# Patient Record
Sex: Male | Born: 1965 | Race: White | Hispanic: No | Marital: Married | Smoking: Current every day smoker
Health system: Southern US, Community
[De-identification: ages and names within clinical notes are randomized; demographics above are authoritative.]

## PROBLEM LIST (undated history)

## (undated) DIAGNOSIS — E785 Hyperlipidemia, unspecified: Secondary | ICD-10-CM

## (undated) HISTORY — DX: Hyperlipidemia, unspecified: E78.5

## (undated) HISTORY — PX: HAND SURGERY: SHX662

## (undated) HISTORY — PX: VASECTOMY: SHX75

---

## 2005-04-06 HISTORY — PX: COLONOSCOPY: SHX174

## 2005-11-02 ENCOUNTER — Ambulatory Visit (HOSPITAL_COMMUNITY): Admission: RE | Admit: 2005-11-02 | Discharge: 2005-11-02 | Payer: Self-pay | Admitting: *Deleted

## 2011-01-05 ENCOUNTER — Ambulatory Visit (AMBULATORY_SURGERY_CENTER): Payer: BC Managed Care – PPO | Admitting: *Deleted

## 2011-01-05 VITALS — Ht 72.0 in | Wt 171.2 lb

## 2011-01-05 DIAGNOSIS — Z800 Family history of malignant neoplasm of digestive organs: Secondary | ICD-10-CM

## 2011-01-05 DIAGNOSIS — Z1211 Encounter for screening for malignant neoplasm of colon: Secondary | ICD-10-CM

## 2011-01-05 MED ORDER — PEG-KCL-NACL-NASULF-NA ASC-C 100 G PO SOLR: ORAL | Status: DC

## 2011-01-06 ENCOUNTER — Encounter: Payer: Self-pay | Admitting: Gastroenterology

## 2011-01-19 ENCOUNTER — Ambulatory Visit (AMBULATORY_SURGERY_CENTER): Payer: BC Managed Care – PPO | Admitting: Gastroenterology

## 2011-01-19 ENCOUNTER — Encounter: Payer: Self-pay | Admitting: Gastroenterology

## 2011-01-19 VITALS — BP 125/69 | HR 65 | Temp 97.9°F | Resp 12 | Ht 72.0 in | Wt 171.0 lb

## 2011-01-19 DIAGNOSIS — Z1211 Encounter for screening for malignant neoplasm of colon: Secondary | ICD-10-CM

## 2011-01-19 DIAGNOSIS — Z800 Family history of malignant neoplasm of digestive organs: Secondary | ICD-10-CM

## 2011-01-19 MED ORDER — SODIUM CHLORIDE 0.9 % IV SOLN: 500.0000 mL | INTRAVENOUS | Status: DC

## 2011-01-19 NOTE — Progress Notes (Signed)
VSS in the recovery room, no complaints of pain.  Pt passing air without difficulty.  Wife at bedside and discharge instructions given, understanding voiced

## 2011-01-19 NOTE — Patient Instructions (Signed)
Please review discharge instructions (blue and green sheets)  Resume your normal medications  Review instructions about hemorrhoids and high fiber diets

## 2011-01-20 ENCOUNTER — Telehealth: Payer: Self-pay | Admitting: *Deleted

## 2011-01-20 NOTE — Telephone Encounter (Signed)

## 2015-11-15 ENCOUNTER — Encounter: Payer: Self-pay | Admitting: Gastroenterology

## 2015-11-27 ENCOUNTER — Encounter: Payer: Self-pay | Admitting: Gastroenterology

## 2016-04-03 DIAGNOSIS — H524 Presbyopia: Secondary | ICD-10-CM | POA: Diagnosis not present

## 2016-04-03 DIAGNOSIS — H5213 Myopia, bilateral: Secondary | ICD-10-CM | POA: Diagnosis not present

## 2016-04-03 DIAGNOSIS — H43813 Vitreous degeneration, bilateral: Secondary | ICD-10-CM | POA: Diagnosis not present

## 2016-07-22 DIAGNOSIS — Z0000 Encounter for general adult medical examination without abnormal findings: Secondary | ICD-10-CM | POA: Diagnosis not present

## 2016-07-22 DIAGNOSIS — Z13220 Encounter for screening for lipoid disorders: Secondary | ICD-10-CM | POA: Diagnosis not present

## 2016-07-22 DIAGNOSIS — Z23 Encounter for immunization: Secondary | ICD-10-CM | POA: Diagnosis not present

## 2016-10-28 ENCOUNTER — Encounter: Payer: Self-pay | Admitting: Gastroenterology

## 2016-12-10 ENCOUNTER — Encounter: Payer: Self-pay | Admitting: Gastroenterology

## 2016-12-22 ENCOUNTER — Encounter: Payer: Self-pay | Admitting: Gastroenterology

## 2017-02-01 ENCOUNTER — Ambulatory Visit (AMBULATORY_SURGERY_CENTER): Payer: BLUE CROSS/BLUE SHIELD

## 2017-02-01 VITALS — Ht 72.0 in | Wt 168.0 lb

## 2017-02-01 DIAGNOSIS — Z800 Family history of malignant neoplasm of digestive organs: Secondary | ICD-10-CM

## 2017-02-01 MED ORDER — SUPREP BOWEL PREP KIT 17.5-3.13-1.6 GM/177ML PO SOLN: 1.0000 | Freq: Once | ORAL | 0 refills | Status: AC

## 2017-02-01 NOTE — Progress Notes (Signed)
No allergies to eggs or soy No diet meds No home oxygen No past problems with anesthesia  Declined emmi 

## 2017-02-15 ENCOUNTER — Other Ambulatory Visit: Payer: Self-pay

## 2017-02-15 ENCOUNTER — Encounter: Payer: Self-pay | Admitting: Gastroenterology

## 2017-02-15 ENCOUNTER — Ambulatory Visit (AMBULATORY_SURGERY_CENTER): Payer: BLUE CROSS/BLUE SHIELD | Admitting: Gastroenterology

## 2017-02-15 VITALS — BP 102/62 | HR 53 | Temp 98.4°F | Resp 13 | Ht 72.0 in | Wt 168.0 lb

## 2017-02-15 DIAGNOSIS — Z1211 Encounter for screening for malignant neoplasm of colon: Secondary | ICD-10-CM | POA: Diagnosis present

## 2017-02-15 DIAGNOSIS — D123 Benign neoplasm of transverse colon: Secondary | ICD-10-CM

## 2017-02-15 DIAGNOSIS — Z1212 Encounter for screening for malignant neoplasm of rectum: Secondary | ICD-10-CM | POA: Diagnosis not present

## 2017-02-15 DIAGNOSIS — Z800 Family history of malignant neoplasm of digestive organs: Secondary | ICD-10-CM

## 2017-02-15 MED ORDER — SODIUM CHLORIDE 0.9 % IV SOLN: 500.0000 mL | INTRAVENOUS | Status: DC

## 2017-02-15 NOTE — Progress Notes (Signed)
A/ox3 pleased with MAC, report to Michele RN 

## 2017-02-15 NOTE — Patient Instructions (Signed)
YOU HAD AN ENDOSCOPIC PROCEDURE TODAY AT [facility]:   Refer to the procedure report that was given to you for any specific questions about what was found during the examination.  If the procedure report does not answer your questions, please call your gastroenterologist to clarify.  If you requested that your care partner not be given the details of your procedure findings, then the procedure report has been included in a sealed envelope for you to review at your convenience later.  YOU SHOULD EXPECT: Some feelings of bloating in the abdomen. Passage of more gas than usual.  Walking can help get rid of the air that was put into your GI tract during the procedure and reduce the bloating. If you had a lower endoscopy (such as a colonoscopy or flexible sigmoidoscopy) you may notice spotting of blood in your stool or on the toilet paper. If you underwent a bowel prep for your procedure, you may not have a normal bowel movement for a few days.  Please Note:  You might notice some irritation and congestion in your nose or some drainage.  This is from the oxygen used during your procedure.  There is no need for concern and it should clear up in a day or so.  SYMPTOMS TO REPORT IMMEDIATELY:   Following lower endoscopy (colonoscopy or flexible sigmoidoscopy):  Excessive amounts of blood in the stool  Significant tenderness or worsening of abdominal pains  Swelling of the abdomen that is new, acute  Fever of 100F or higher  For urgent or emergent issues, a gastroenterologist can be reached at any hour by calling [phone].   DIET:  We do recommend a small meal at first, but then you may proceed to your regular diet.  Drink plenty of fluids but you should avoid alcoholic beverages for 24 hours.  ACTIVITY:  You should plan to take it easy for the rest of today and you should NOT DRIVE or use heavy machinery until tomorrow (because of the sedation medicines used during the test).     FOLLOW UP: Our staff will call the number listed on your records the next business day following your procedure to check on you and address any questions or concerns that you may have regarding the information given to you following your procedure. If we do not reach you, we will leave a message.  However, if you are feeling well and you are not experiencing any problems, there is no need to return our call.  We will assume that you have returned to your regular daily activities without incident.  If any biopsies were taken you will be contacted by phone or by letter within the next 1-3 weeks.  Please call us at [phone] if you have not heard about the biopsies in 3 weeks.   Await biopsy results to determine next repeat Colonoscopy screening Polyps (handout given) Hemorrhoids (handout given) Hemorrhoids banding (handout given)  SIGNATURES/CONFIDENTIALITY: You and/or your care partner have signed paperwork which will be entered into your electronic medical record.  These signatures attest to the fact that that the information above on your After Visit Summary has been reviewed and is understood.  Full responsibility of the confidentiality of this discharge information lies with you and/or your care-partner.

## 2017-02-15 NOTE — Progress Notes (Signed)
Pt. Reports no change in medical or surgical history since his pre-visit 02/01/2017.

## 2017-02-15 NOTE — Progress Notes (Signed)
Called to room to assist during endoscopic procedure.  Patient ID and intended procedure confirmed with present staff. Received instructions for my participation in the procedure from the performing physician.  

## 2017-02-15 NOTE — Op Note (Signed)
[facility] Patient Name: Alexander Krause Procedure Date: 02/15/2017 8:59 AM MRN: [id number] Endoscopist: Mallie Mussel L. Loletha Carrow , MD Age: 51 Referring MD:  Date of Birth: 1965-10-13 Gender: Male Account #: [id number] Procedure:                Colonoscopy Indications:              Screening in patient at increased risk: Colorectal                            cancer in father 6 or older Medicines:                Monitored Anesthesia Care Procedure:                Pre-Anesthesia Assessment:                           - Prior to the procedure, a History and Physical                            was performed, and patient medications and                            allergies were reviewed. The patient's tolerance of                            previous anesthesia was also reviewed. The risks                            and benefits of the procedure and the sedation                            options and risks were discussed with the patient.                            All questions were answered, and informed consent                            was obtained. Prior Anticoagulants: The patient has                            taken no previous anticoagulant or antiplatelet                            agents. ASA Grade Assessment: II - A patient with                            mild systemic disease. After reviewing the risks                            and benefits, the patient was deemed in                            satisfactory condition to undergo the procedure.  After obtaining informed consent, the colonoscope                            was passed under direct vision. Throughout the                            procedure, the patient's blood pressure, pulse, and                            oxygen saturations were monitored continuously. The                            Colonoscope was introduced through the anus and                            advanced to the the cecum,  identified by                            appendiceal orifice and ileocecal valve. The                            colonoscopy was performed with moderate difficulty                            due to significant looping. Successful completion                            of the procedure was aided by changing the patient                            to a supine position and using manual pressure. The                            patient tolerated the procedure well. The quality                            of the bowel preparation was excellent. The                            ileocecal valve, appendiceal orifice, and rectum                            were photographed. The quality of the bowel                            preparation was evaluated using the BBPS [facility]                            Bowel Preparation Scale) with scores of: Right                            Colon = 3, Transverse Colon = 3 and Left Colon = 3                            (  entire mucosa seen well with no residual staining,                            small fragments of stool or opaque liquid). The                            total BBPS score equals 9. The bowel preparation                            used was SUPREP. Scope In: 9:06:52 AM Scope Out: 9:32:51 AM Scope Withdrawal Time: 0 hours 13 minutes 52 seconds  Total Procedure Duration: 0 hours 25 minutes 59 seconds  Findings:                 The perianal exam findings include internal                            hemorrhoids that prolapse with straining, but                            require manual replacement into the anal canal                            (Grade III).                           The sigmoid colon revealed significantly excessive                            looping.                           Two sessile polyps were found in the transverse                            colon. The polyps were 2 to 4 mm in size. These                            polyps were removed with  a cold snare. Resection                            and retrieval were complete.                           Retroflexion in the rectum was not performed due to                            anatomy. There were prominent veins in the rectum.                           The exam was otherwise without abnormality. Complications:            No immediate complications. Estimated Blood Loss:     Estimated blood loss was minimal. Impression:               - Internal  hemorrhoids that prolapse with                            straining, but require manual replacement into the                            anal canal (Grade III) found on perianal exam.                           - There was significant looping of the colon.                           - Two 2 to 4 mm polyps in the transverse colon,                            removed with a cold snare. Resected and retrieved.                           - The examination was otherwise normal. Recommendation:           - Patient has a contact number available for                            emergencies. The signs and symptoms of potential                            delayed complications were discussed with the                            patient. Return to normal activities tomorrow.                            Written discharge instructions were provided to the                            patient.                           - Resume previous diet.                           - Continue present medications.                           - Await pathology results.                           - Repeat colonoscopy in 5 years for surveillance. Alexander Krause L. Loletha Carrow, MD 02/15/2017 9:41:52 AM This report has been signed electronically.

## 2017-02-16 ENCOUNTER — Telehealth: Payer: Self-pay | Admitting: *Deleted

## 2017-02-16 NOTE — Telephone Encounter (Signed)
  Follow up Call-  Call back number 02/15/2017  Post procedure Call Back phone  # [phone]  Permission to leave phone message Yes  Some recent data might be hidden     Patient questions:  Do you have a fever, pain , or abdominal swelling? No. Pain Score  0 *  Have you tolerated food without any problems? Yes.    Have you been able to return to your normal activities? Yes.    Do you have any questions about your discharge instructions: Diet   No. Medications  No. Follow up visit  No.  Do you have questions or concerns about your Care? No.  Actions: * If pain score is 4 or above: No action needed, pain <4.

## 2017-02-22 ENCOUNTER — Encounter: Payer: Self-pay | Admitting: Gastroenterology

## 2017-02-23 ENCOUNTER — Other Ambulatory Visit: Payer: Self-pay

## 2017-02-23 DIAGNOSIS — K648 Other hemorrhoids: Secondary | ICD-10-CM

## 2017-03-03 ENCOUNTER — Telehealth: Payer: Self-pay | Admitting: Gastroenterology

## 2017-03-03 ENCOUNTER — Other Ambulatory Visit: Payer: Self-pay

## 2017-03-03 DIAGNOSIS — K648 Other hemorrhoids: Secondary | ICD-10-CM

## 2017-03-03 NOTE — Telephone Encounter (Signed)
Patient is rescheduled to [facility], 11/29 at 9:30. Spoke to patient's wife, she is aware to let him know to arrive at 9:10 and nothing to eat or drink after midnight. Also updated PCP information to Dr. Lennette Bihari Via.

## 2017-03-04 ENCOUNTER — Ambulatory Visit (HOSPITAL_COMMUNITY): Payer: BLUE CROSS/BLUE SHIELD

## 2017-03-04 ENCOUNTER — Telehealth: Payer: Self-pay

## 2017-03-04 ENCOUNTER — Ambulatory Visit
Admission: RE | Admit: 2017-03-04 | Discharge: 2017-03-04 | Disposition: A | Discharge status: Home / Self Care | Payer: BLUE CROSS/BLUE SHIELD | Source: Ambulatory Visit | Attending: Gastroenterology | Admitting: Gastroenterology

## 2017-03-04 ENCOUNTER — Other Ambulatory Visit (INDEPENDENT_AMBULATORY_CARE_PROVIDER_SITE_OTHER): Payer: BLUE CROSS/BLUE SHIELD

## 2017-03-04 DIAGNOSIS — K648 Other hemorrhoids: Secondary | ICD-10-CM

## 2017-03-04 LAB — CBC WITH DIFFERENTIAL/PLATELET
BASOS PCT: 0.7 % (ref 0.0–3.0)
Basophils Absolute: 0 10*3/uL (ref 0.0–0.1)
EOS ABS: 0.4 10*3/uL (ref 0.0–0.7)
EOS PCT: 5.3 % — AB (ref 0.0–5.0)
HEMATOCRIT: 47.2 % (ref 39.0–52.0)
HEMOGLOBIN: 16.2 g/dL (ref 13.0–17.0)
LYMPHS PCT: 39.1 % (ref 12.0–46.0)
Lymphs Abs: 2.8 10*3/uL (ref 0.7–4.0)
MCHC: 34.3 g/dL (ref 30.0–36.0)
MCV: 95.6 fl (ref 78.0–100.0)
MONOS PCT: 8.1 % (ref 3.0–12.0)
Monocytes Absolute: 0.6 10*3/uL (ref 0.1–1.0)
Neutro Abs: 3.3 10*3/uL (ref 1.4–7.7)
Neutrophils Relative %: 46.8 % (ref 43.0–77.0)
Platelets: 280 10*3/uL (ref 150.0–400.0)
RBC: 4.93 Mil/uL (ref 4.22–5.81)
RDW: 13.6 % (ref 11.5–15.5)
WBC: 7.1 10*3/uL (ref 4.0–10.5)

## 2017-03-04 LAB — HEPATIC FUNCTION PANEL
ALT: 14 U/L (ref 0–53)
AST: 17 U/L (ref 0–37)
Albumin: 4.1 g/dL (ref 3.5–5.2)
Alkaline Phosphatase: 88 U/L (ref 39–117)
BILIRUBIN TOTAL: 0.5 mg/dL (ref 0.2–1.2)
Bilirubin, Direct: 0 mg/dL (ref 0.0–0.3)
Total Protein: 6.7 g/dL (ref 6.0–8.3)

## 2017-03-04 NOTE — Telephone Encounter (Signed)
Thank you.  I reviewed the entire ultrasound report, and I spoke with Alexander Krause.  He understands and is agreeable to the CT scan.  Please place the order for CT with and without contrast (urogram protocol).

## 2017-03-04 NOTE — Telephone Encounter (Signed)
Call report from radiology on US done today:  2. Unexpected finding of hydronephrotic right kidney. 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 CT Abdomen and Pelvis without and with contrast (urogram protocol) would best evaluate further. These results will be called to the ordering clinician or representative by the Radiologist Assistant, and communication documented in the PACS or zVision Dashboard.

## 2017-03-05 ENCOUNTER — Other Ambulatory Visit: Payer: Self-pay

## 2017-03-05 DIAGNOSIS — N1330 Unspecified hydronephrosis: Secondary | ICD-10-CM

## 2017-03-05 NOTE — Telephone Encounter (Signed)
Spoke to patient, let him know when the CT is scheduled for on 12/4. He will need to reschedule this, I have given him the phone number to reschedule at his convenience. He understands to come pick up the prep and instructions. Verbally went over instructions to be NPO 4 hours prior, drink one bottle of contrast 2 hours prior and next bottle 1 hour prior to test.

## 2017-03-08 ENCOUNTER — Telehealth: Payer: Self-pay | Admitting: Gastroenterology

## 2017-03-08 ENCOUNTER — Other Ambulatory Visit: Payer: Self-pay

## 2017-03-08 DIAGNOSIS — N1330 Unspecified hydronephrosis: Secondary | ICD-10-CM

## 2017-03-08 NOTE — Telephone Encounter (Signed)
Changed place of service, [place] [facility] to call patient to schedule.

## 2017-03-09 ENCOUNTER — Ambulatory Visit (HOSPITAL_COMMUNITY): Admission: RE | Admit: 2017-03-09 | Payer: BLUE CROSS/BLUE SHIELD | Source: Ambulatory Visit

## 2017-03-31 ENCOUNTER — Ambulatory Visit
Admission: RE | Admit: 2017-03-31 | Discharge: 2017-03-31 | Disposition: A | Discharge status: Home / Self Care | Payer: BLUE CROSS/BLUE SHIELD | Source: Ambulatory Visit | Attending: Gastroenterology | Admitting: Gastroenterology

## 2017-03-31 DIAGNOSIS — N1330 Unspecified hydronephrosis: Secondary | ICD-10-CM

## 2017-03-31 MED ORDER — IOPAMIDOL (ISOVUE-300) INJECTION 61%
125.0000 mL | Freq: Once | INTRAVENOUS | Status: AC | PRN
  Administered 2017-03-31: 125 mL via INTRAVENOUS

## 2017-04-02 ENCOUNTER — Telehealth: Payer: Self-pay | Admitting: Gastroenterology

## 2017-04-02 NOTE — Telephone Encounter (Signed)
Called patient's wife back, I let her know that Dr. Loletha Carrow has not had a chance to look at the results yet but as soon as he has will let them know.

## 2017-04-02 NOTE — Telephone Encounter (Signed)
Patient wife calling for CT results from 12.26.18.

## 2017-04-05 ENCOUNTER — Other Ambulatory Visit: Payer: Self-pay

## 2017-04-05 ENCOUNTER — Encounter: Payer: Self-pay | Admitting: Gastroenterology

## 2017-04-05 DIAGNOSIS — N289 Disorder of kidney and ureter, unspecified: Secondary | ICD-10-CM

## 2017-04-19 ENCOUNTER — Telehealth: Payer: Self-pay

## 2017-04-19 NOTE — Telephone Encounter (Signed)
Patient has appointment with Dr. Tresa Moore on 05/04/17 at 3:00 pm.

## 2017-05-25 ENCOUNTER — Other Ambulatory Visit: Payer: Self-pay | Admitting: Urology

## 2017-05-25 DIAGNOSIS — Z125 Encounter for screening for malignant neoplasm of prostate: Secondary | ICD-10-CM | POA: Diagnosis not present

## 2017-05-25 DIAGNOSIS — N130 Hydronephrosis with ureteropelvic junction obstruction: Secondary | ICD-10-CM | POA: Diagnosis not present

## 2017-06-09 ENCOUNTER — Encounter (HOSPITAL_COMMUNITY): Payer: Self-pay

## 2017-06-09 ENCOUNTER — Encounter (HOSPITAL_COMMUNITY)
Admission: RE | Admit: 2017-06-09 | Discharge: 2017-06-09 | Disposition: A | Discharge status: Home / Self Care | Payer: BLUE CROSS/BLUE SHIELD | Source: Ambulatory Visit | Attending: Urology | Admitting: Urology

## 2017-06-09 DIAGNOSIS — N130 Hydronephrosis with ureteropelvic junction obstruction: Secondary | ICD-10-CM | POA: Diagnosis not present

## 2017-06-09 DIAGNOSIS — N1330 Unspecified hydronephrosis: Secondary | ICD-10-CM | POA: Diagnosis not present

## 2017-06-09 MED ORDER — FUROSEMIDE 10 MG/ML IJ SOLN
INTRAMUSCULAR | Status: AC
  Filled 2017-06-09: qty 4

## 2017-06-09 MED ORDER — FUROSEMIDE 10 MG/ML IJ SOLN: 38.0000 mg | Freq: Once | INTRAMUSCULAR | Status: DC

## 2017-06-09 MED ORDER — TECHNETIUM TC 99M MERTIATIDE
5.2000 | Freq: Once | INTRAVENOUS | Status: AC | PRN
  Administered 2017-06-09: 5.2 via INTRAVENOUS

## 2017-09-21 DIAGNOSIS — N130 Hydronephrosis with ureteropelvic junction obstruction: Secondary | ICD-10-CM | POA: Diagnosis not present

## 2017-09-21 DIAGNOSIS — Z125 Encounter for screening for malignant neoplasm of prostate: Secondary | ICD-10-CM | POA: Diagnosis not present

## 2018-01-26 IMAGING — CT CT ABD-PEL WO/W CM
4 of 12 series · 13 of 46 positions shown, 17 images · IV contrast ([ID] [id number])
Comparison: Abdominal ultrasound dated 03/04/2017

CLINICAL DATA: Right hydronephrosis.  History of vasectomy.

EXAM:
CT ABDOMEN AND PELVIS WITHOUT AND WITH CONTRAST
TECHNIQUE: Multidetector CT imaging of the abdomen and pelvis was performed
following the standard protocol before and following the bolus
administration of intravenous contrast.
CONTRAST:  125mL [id number] IOPAMIDOL (JJE9PA-ZSS) INJECTION 61%

[Series 9: hematuria > 45, prone · axial · 0.70mm/px · z∈[-328,-164]mm · 2 of 99 slices shown, 5 images]
[im 33/99  soft-tissue]
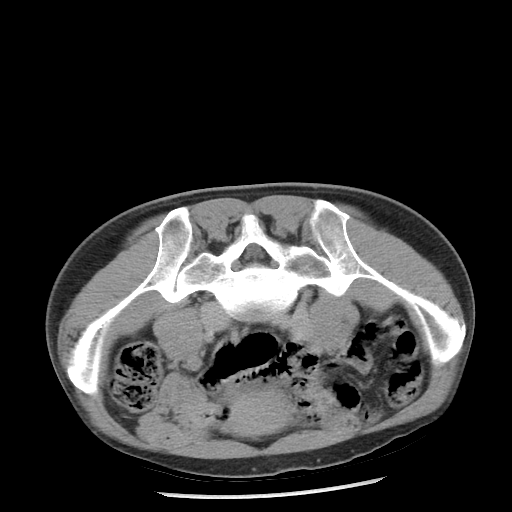
[im 33/99  lung]
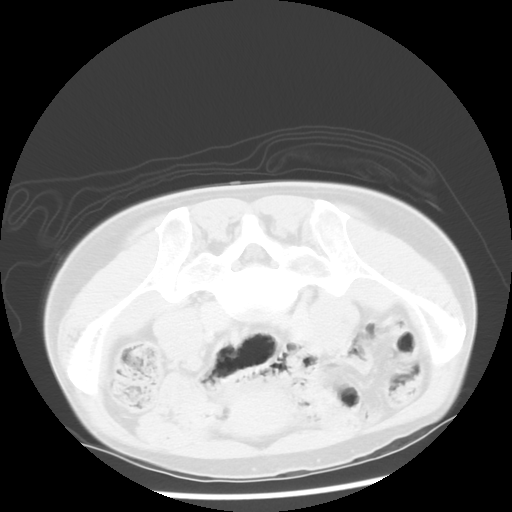
[im 33/99  bone]
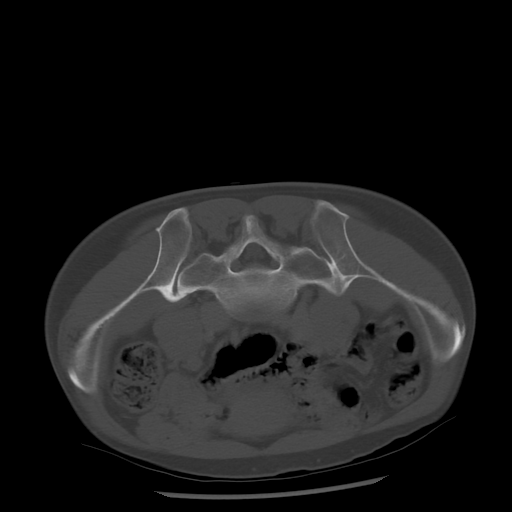
[im 66/99  soft-tissue]
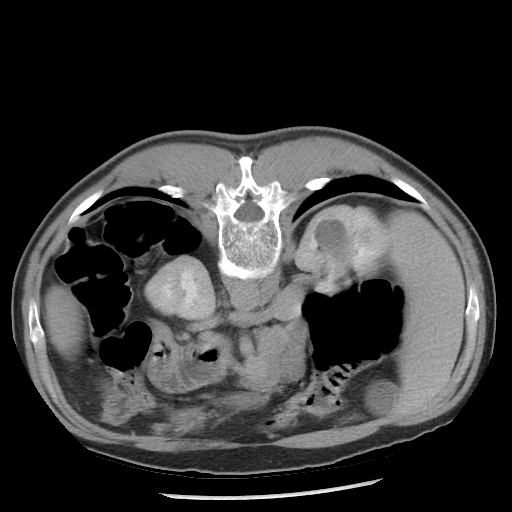
[im 66/99  lung]
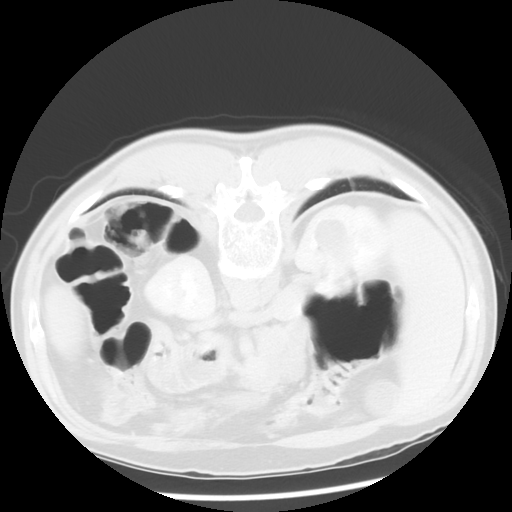

[Series 103: sagittal · sagittal · 0.92mm/px · 4 of 144 slices shown]
[im 29/144  soft-tissue]
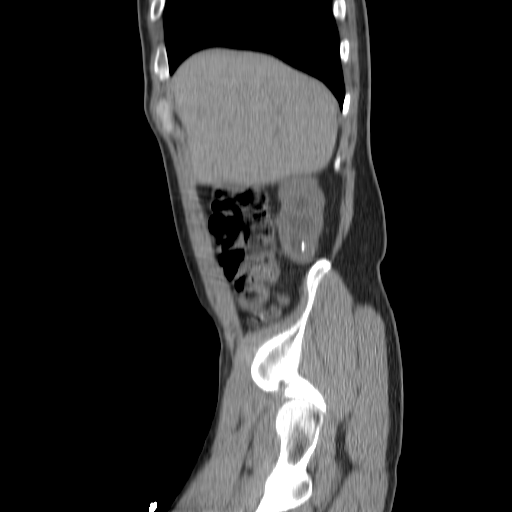
[im 58/144  soft-tissue]
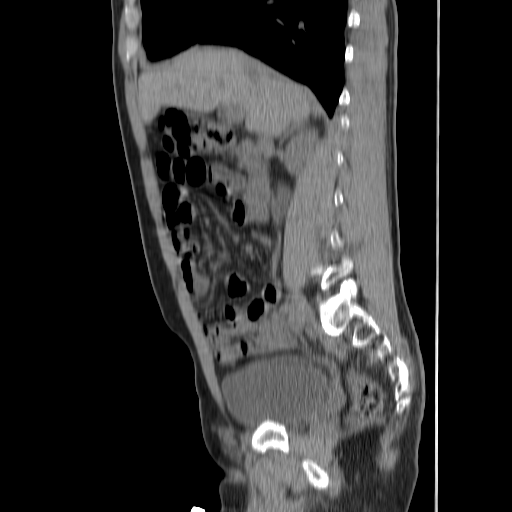
[im 86/144  soft-tissue]
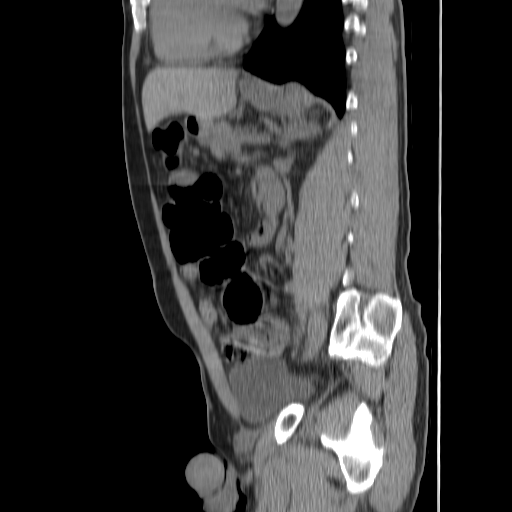
[im 115/144  soft-tissue]
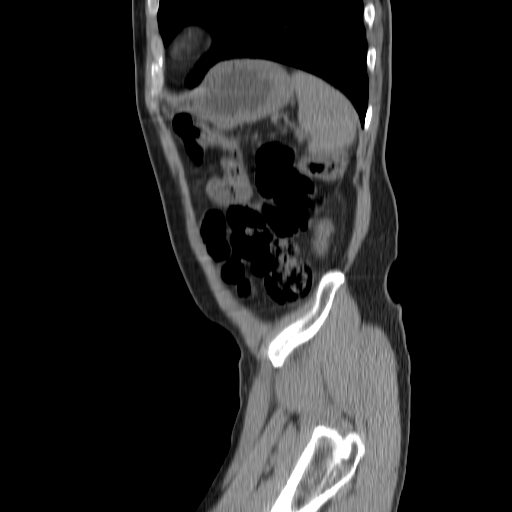

[Series 104: coronal · coronal · 0.92mm/px · 3 of 132 slices shown, 4 images]
[im 44/132  soft-tissue]
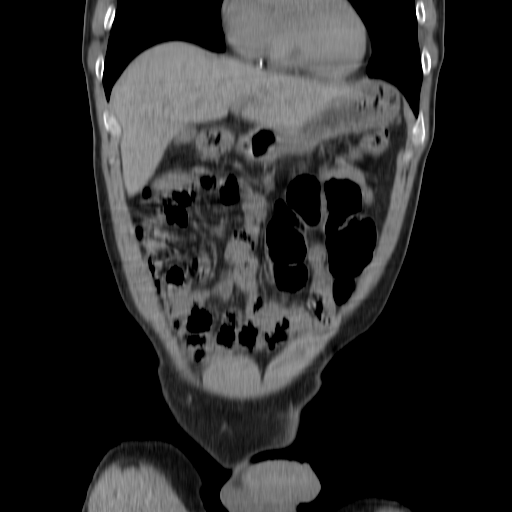
[im 59/132  soft-tissue]
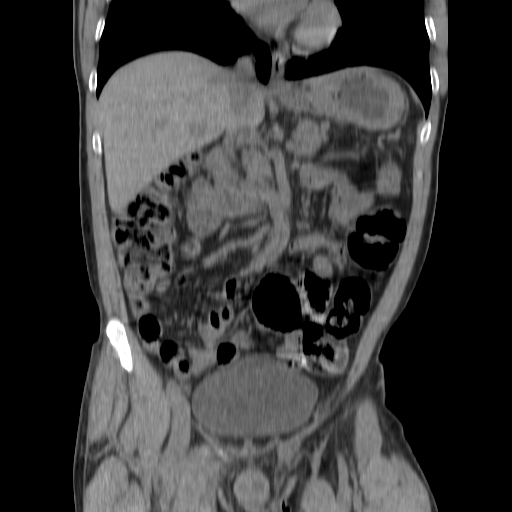
[im 59/132  bone]
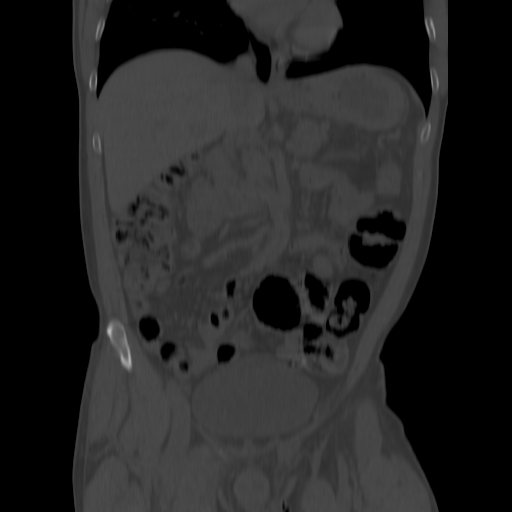
[im 73/132  soft-tissue]
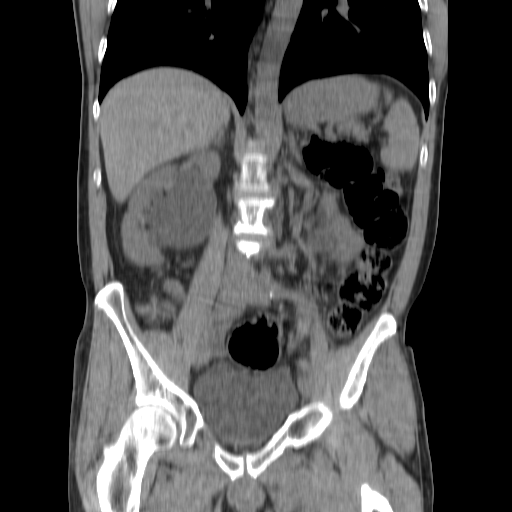

[Series 602: sagittal body · sagittal · 0.97mm/px · 4 of 144 slices shown]
[im 29/144  soft-tissue]
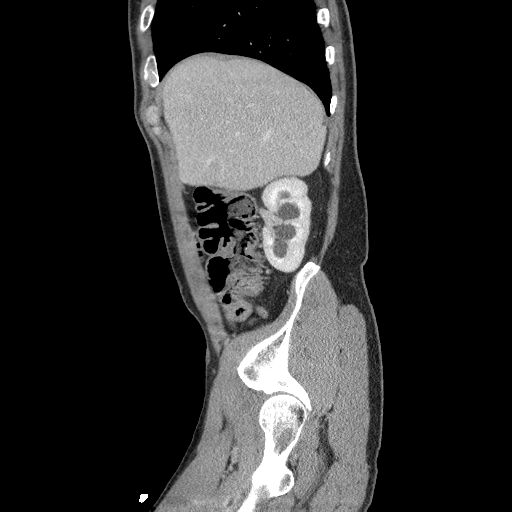
[im 58/144  soft-tissue]
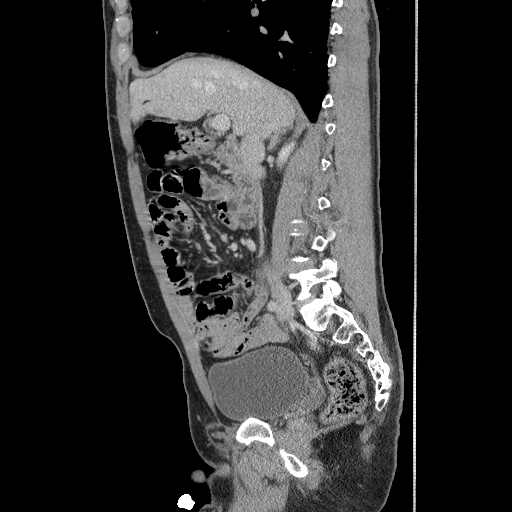
[im 86/144  soft-tissue]
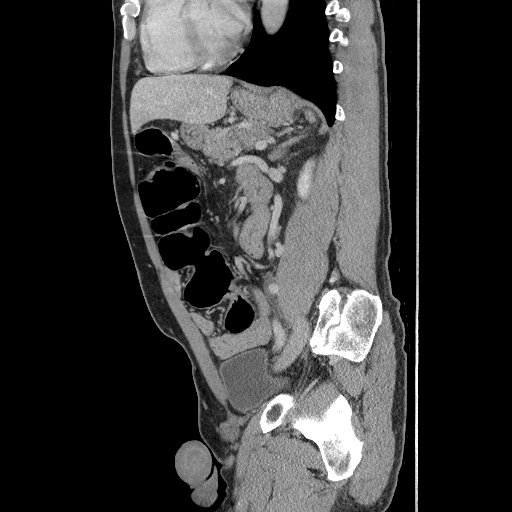
[im 115/144  soft-tissue]
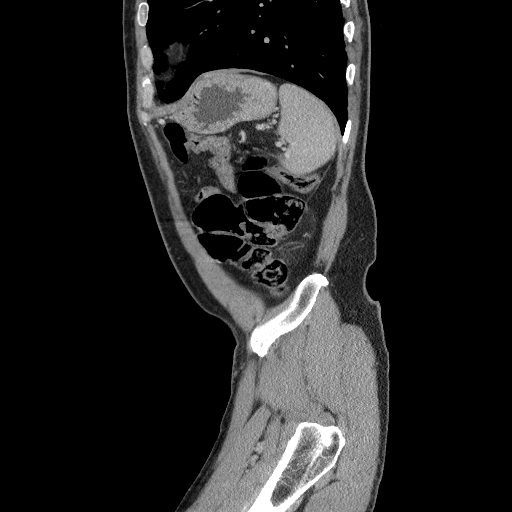

[13 of 46 positions shown; findings below may reference images not displayed]

FINDINGS: Lower chest: 5 by 3 mm right middle lobe nodule probably along the
minor fissure, image [DATE], only partially included on today's exam.
Separate 5 by 3 mm nodule in the right lower lobe along the major
fissure, image [DATE]. Subsegmental atelectasis or scarring in the
posterior basal segments of both lower lobes.

Hepatobiliary: 0.8 by 0.7 cm hypodense lesion in segment 7 of the
liver on image [DATE] is probably a cyst given its persistence
hypodense appearance on delayed images, although technically
nonspecific due to small size. Other smaller similar lesions
included a 3 mm lesion in the left hepatic lobe on image [DATE] and a
4 mm hypodense lesion in segment 4a of the liver on image [DATE]. The
gallbladder appears unremarkable.

Pancreas: Unremarkable

Spleen: Unremarkable

Adrenals/Urinary Tract: Adrenal glands normal. Moderate to prominent
right hydronephrosis without hydroureter, without significant delay
in nephrogram and with excretion of contrast into the dilated
collecting system and into the nondilated right ureter on the
delayed images. No calcification or abnormal focal enhancement is
identified at the UPJ transition point.

3 mm right kidney lower pole nonobstructive renal calculus on image
49/2, questionable adjacent 1 mm calculus in the right kidney lower
pole. Left-sided extrarenal pelvis without left hydronephrosis. No
additional urinary tract calculi. No appreciable filling defect
along the urothelium urinary bladder unremarkable.

Stomach/Bowel: Prominent stool throughout the colon favors
constipation. Borderline thickened appendix at 7 mm but without
periappendiceal inflammatory findings.

Vascular/Lymphatic: Common iliac atherosclerotic calcification. No
pathologic adenopathy identified.

Reproductive: Unremarkable

Other: No supplemental non-categorized findings.

Musculoskeletal: Bridging spurring of the sacroiliac joints. Lumbar
spondylosis and degenerative disc disease with mild resulting
impingement at L2-3, L3-4, and L4-5.
IMPRESSION: 1. Bilateral extrarenal pelvis with notable blunting of the calices
and infundibular dilatation on the right side, with transition point
at the right UPJ. Appearance compatible with chronic partial right
UPJ obstruction. There is some mild right kidney lower pole
nonobstructive nephrolithiasis but no ureteral were UPJ stone is
identified.
2. Several small pulmonary nodules of the right lung base averaging
4 mm in diameter. No follow-up needed if patient is low-risk (and
has no known or suspected primary neoplasm). Non-contrast chest CT
can be considered in 12 months if patient is high-risk. This
recommendation follows the consensus statement: Guidelines for
Management of Incidental Pulmonary Nodules Detected on CT Images:
3. Several small hypodense lesions of the liver are technically
nonspecific due to small size, although statistically highly likely
to be benign.
4.  Prominent stool throughout the colon favors constipation.
5. The appendix is diffusely borderline thickened at 7 mm diameter,
but there no surrounding periappendiceal inflammatory changes to
suggest appendicitis, or central appendiceal hypodensity to suggest
mucocele or mass. This is likely an incidental finding.
6. Mild foraminal impingement in the lumbar spine due to spondylosis
and degenerative disc disease.

## 2018-02-28 DIAGNOSIS — H43813 Vitreous degeneration, bilateral: Secondary | ICD-10-CM | POA: Diagnosis not present

## 2018-02-28 DIAGNOSIS — H524 Presbyopia: Secondary | ICD-10-CM | POA: Diagnosis not present

## 2018-02-28 DIAGNOSIS — H5213 Myopia, bilateral: Secondary | ICD-10-CM | POA: Diagnosis not present

## 2018-04-11 ENCOUNTER — Telehealth: Payer: Self-pay

## 2018-04-11 NOTE — Telephone Encounter (Signed)
Sent a mychart message to the patient and sent a fax to his primary doctor Aeronautical engineer at [place]) Fax [phone]

## 2018-04-11 NOTE — Telephone Encounter (Signed)
-----   Message from Nelida Meuse III, MD sent at 04/07/2018 12:21 PM EST ----- Regarding: RE: CT chest 1 yr follow up Reminder to him and PCP.  - HD ----- Message ----- From: Elias Else, CMA Sent: 04/05/2018   8:45 AM EST To: Nelida Meuse III, MD Subject: CT chest 1 yr follow up                        Dr Loletha Carrow,   Do you want to order the CT of the chest in a 1 yr follow up?  Would you prefer to have the primary order, if so I can just give a friendly reminder to him and his PCP.  Thanks, Vivien Rota  ----- Message ----- From: Timothy Lasso, RN Sent: 04/05/2018 To: Elias Else, CMA   follow-up CT scan of the chest in a year just to be sure they do not change (see 12/31 note)

## 2018-06-21 DIAGNOSIS — R9389 Abnormal findings on diagnostic imaging of other specified body structures: Secondary | ICD-10-CM | POA: Diagnosis not present

## 2018-09-19 DIAGNOSIS — Z23 Encounter for immunization: Secondary | ICD-10-CM | POA: Diagnosis not present

## 2018-09-19 DIAGNOSIS — Z125 Encounter for screening for malignant neoplasm of prostate: Secondary | ICD-10-CM | POA: Diagnosis not present

## 2018-09-19 DIAGNOSIS — Z0000 Encounter for general adult medical examination without abnormal findings: Secondary | ICD-10-CM | POA: Diagnosis not present

## 2018-09-19 DIAGNOSIS — Z13220 Encounter for screening for lipoid disorders: Secondary | ICD-10-CM | POA: Diagnosis not present

## 2019-04-27 DIAGNOSIS — H52203 Unspecified astigmatism, bilateral: Secondary | ICD-10-CM | POA: Diagnosis not present

## 2019-04-27 DIAGNOSIS — H524 Presbyopia: Secondary | ICD-10-CM | POA: Diagnosis not present

## 2019-04-27 DIAGNOSIS — H43813 Vitreous degeneration, bilateral: Secondary | ICD-10-CM | POA: Diagnosis not present

## 2019-04-27 DIAGNOSIS — H5213 Myopia, bilateral: Secondary | ICD-10-CM | POA: Diagnosis not present

## 2019-12-21 ENCOUNTER — Other Ambulatory Visit: Payer: Self-pay

## 2019-12-21 LAB — SARS CORONAVIRUS 2 (TAT 6-24 HRS): SARS Coronavirus 2: NEGATIVE

## 2020-01-21 DIAGNOSIS — Z20822 Contact with and (suspected) exposure to covid-19: Secondary | ICD-10-CM | POA: Diagnosis not present

## 2020-01-31 DIAGNOSIS — Z20822 Contact with and (suspected) exposure to covid-19: Secondary | ICD-10-CM | POA: Diagnosis not present

## 2020-02-01 DIAGNOSIS — M720 Palmar fascial fibromatosis [Dupuytren]: Secondary | ICD-10-CM | POA: Diagnosis not present

## 2020-05-15 DIAGNOSIS — H5213 Myopia, bilateral: Secondary | ICD-10-CM | POA: Diagnosis not present

## 2020-05-15 DIAGNOSIS — H524 Presbyopia: Secondary | ICD-10-CM | POA: Diagnosis not present

## 2020-09-26 DIAGNOSIS — M720 Palmar fascial fibromatosis [Dupuytren]: Secondary | ICD-10-CM | POA: Diagnosis not present

## 2020-10-18 ENCOUNTER — Other Ambulatory Visit: Payer: Self-pay

## 2020-10-18 ENCOUNTER — Emergency Department (HOSPITAL_BASED_OUTPATIENT_CLINIC_OR_DEPARTMENT_OTHER)
Admission: EM | Admit: 2020-10-18 | Discharge: 2020-10-18 | Disposition: A | Discharge status: Home / Self Care | Payer: No Typology Code available for payment source | Attending: Emergency Medicine | Admitting: Emergency Medicine

## 2020-10-18 ENCOUNTER — Encounter (HOSPITAL_BASED_OUTPATIENT_CLINIC_OR_DEPARTMENT_OTHER): Payer: Self-pay | Admitting: Emergency Medicine

## 2020-10-18 DIAGNOSIS — F17210 Nicotine dependence, cigarettes, uncomplicated: Secondary | ICD-10-CM | POA: Insufficient documentation

## 2020-10-18 DIAGNOSIS — S81801A Unspecified open wound, right lower leg, initial encounter: Secondary | ICD-10-CM | POA: Insufficient documentation

## 2020-10-18 DIAGNOSIS — S8991XA Unspecified injury of right lower leg, initial encounter: Secondary | ICD-10-CM | POA: Diagnosis not present

## 2020-10-18 DIAGNOSIS — W57XXXA Bitten or stung by nonvenomous insect and other nonvenomous arthropods, initial encounter: Secondary | ICD-10-CM | POA: Diagnosis not present

## 2020-10-18 MED ORDER — BACITRACIN ZINC 500 UNIT/GM EX OINT: 1.0000 "application " | TOPICAL_OINTMENT | Freq: Two times a day (BID) | CUTANEOUS | 0 refills | Status: DC

## 2020-10-18 NOTE — Discharge Instructions (Addendum)
1.  At this time I suspect your wound is either a direct and continuous pressure wound from inside your boot or chemically injured or abraded skin.  There is not the typical surrounding redness and swelling seen with insect bites whether it be brown recluse or other type of insect. 2.  Apply bacitracin ointment twice a day.  Keep the wound well padded and keep all pressure off of it.  Elevate the leg is much as possible over the next 2 to 3 days. 3.  Try to schedule a follow-up appointment with Dr. Marla Roe.  Their office can see lower extremity wounds and help manage them if they become complicated. 4.  Return to emergency department immediately if you see a large amount of redness or swelling streaking up your leg, developing well wound or the wound starts eroding in the middle and going through deeper layers of skin.

## 2020-10-18 NOTE — ED Provider Notes (Signed)
[facility] DE[facility] Provider Note   CSN: [id number] Arrival date & time: 10/18/20  1241     History Chief Complaint  Patient presents with   Insect Bite    Alexander Krause is a 55 y.o. male.  HPI The patient went to work his usual route today.  He wears work boots.  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  Patient reports that around 11 today he noticed some burning and stinging inside of his boot by his ankle on the right.  He did not take the boot off at that time he just tried to rub it and see if he could get some insect or something out of it.  He thought a yellow jacket might of gotten into his boot.  He continued to do his regular work rounds despite stinging and irritation to the medial ankle.  When he took his sock off and examined the area several hours later there was a black wound present.  He never saw an actual insect or spider involved in the incident.  He reports the wound has not changed since he first examined it.  He reports is a little uncomfortable with direct pressure but not particularly painful.  He has had no pain numbness tingling into the foot or other adjacent structures.  He showed a picture to his wife who showed it to other medical providers and they were very concerned for a necrotic type wound that might be due to a brown recluse.  Patient reports he feels absolutely fine and has no other symptoms.  He is healthy at baseline.    History reviewed. No pertinent past medical history.  There are no problems to display for this patient.   Past Surgical History:  Procedure Laterality Date   COLONOSCOPY  2007   DR.ORR   VASECTOMY         Family History  Problem Relation Age of Onset   Cancer Mother 73       ORAL CANCER   Colon cancer Father 3    Social History   Tobacco Use   Smoking status: Every Day    Packs/day:  1.00    Years: 16.00    Pack years: 16.00    Types: Cigarettes   Smokeless tobacco: Never  Substance Use Topics   Alcohol use: Yes    Comment: maybe once weekly   Drug use: No    Home Medications Prior to Admission medications   Medication Sig Start Date End Date Taking? Authorizing Provider  bacitracin ointment Apply 1 application topically 2 (two) times daily. 10/18/20  Yes Charlesetta Shanks, MD  ibuprofen (ADVIL,MOTRIN) 600 MG tablet Take 600 mg by mouth every 6 (six) hours as needed.    [provider]    Allergies    Patient has no known allergies.  Review of Systems   Review of Systems 10 systems reviewed and negative except as per HPI Physical Exam Updated Vital Signs BP 138/84 (BP Location: Right Arm)   Pulse 83   Temp 98.2 F (36.8 C) (Oral)   Resp 16   Ht 6' (1.829 m)   Wt 77.1 kg   SpO2 98%   BMI 23.06 kg/m   Physical Exam Constitutional:      Comments: Patient is alert and clinically well in appearance.  No distress.  Eyes:     Extraocular Movements: Extraocular movements intact.  Pulmonary:  Effort: Pulmonary effort is normal.  Musculoskeletal:        General: Normal range of motion.     Comments: Patient has a eschar wound on the medial right ankle.  The margins are well circumscribed.  There is no surrounding erythema or local inflammatory reaction or streaking.  The foot and calf are normal.  No effusion of the joint.  Dorsalis pedis pulses 2+ and strong.  The foot is dry with normal neurologic function.  The calf is soft and nontender.  See attached images.   Neurological:     General: No focal deficit present.     Mental Status: He is oriented to person, place, and time.     Coordination: Coordination normal.  Psychiatric:        Mood and Affect: Mood normal.         ED Results / Procedures / Treatments   Labs (all labs ordered are listed, but only abnormal results are displayed) Labs Reviewed - No data to  display  EKG None  Radiology No results found.  Procedures Procedures   Medications Ordered in ED Medications - No data to display  ED Course  I have reviewed the triage vital signs and the nursing notes.  Pertinent labs & imaging results that were available during my care of the patient were reviewed by me and considered in my medical decision making (see chart for details).    MDM Rules/Calculators/A&P                          At this time, patient's wound appears less likely to be an insect envenomation.  There is no local inflammatory reaction or swelling.  It appears to be an eschar I suspect is a mechanical or possibly chemical origin.  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  He reports he is always sees dripping of the truck but is usually water.  He did not witness any insect envenomation.  Other consideration is for a focal pressure wound as the patient did not remove his boot or relieve the focal irritation at the time of onset but continued to to walk and work for several more hours.  The image from the patient's phone was taken hours before he presented to the emergency department.  There has been no evolution in the wound.  At this time plan will be for focal wound care with antibiotic ointment and protecting from any pressure.  Strict return precautions reviewed.  Patient is recommended to try to schedule follow-up with wound care with Dr. Marla Roe in the event that this does show signs of a necrotic focus that would require more aggressive treatment.  Currently I do not suspect this is necrotic but we have reviewed care, return and follow-up plan Final Clinical Impression(s) / ED Diagnoses Final diagnoses:  Wound of right lower extremity, initial encounter    Rx / DC Orders ED Discharge Orders          Ordered    bacitracin ointment  2 times daily        10/18/20 1557              Charlesetta Shanks, MD 10/18/20 1609

## 2020-10-18 NOTE — ED Triage Notes (Signed)
Pt states he was bit by something 3 hours ago. Pt states it hurt initially but not now. The pt has a black area to R inner ankle area.

## 2021-02-17 DIAGNOSIS — L814 Other melanin hyperpigmentation: Secondary | ICD-10-CM | POA: Diagnosis not present

## 2021-02-17 DIAGNOSIS — L821 Other seborrheic keratosis: Secondary | ICD-10-CM | POA: Diagnosis not present

## 2021-02-17 DIAGNOSIS — L819 Disorder of pigmentation, unspecified: Secondary | ICD-10-CM | POA: Diagnosis not present

## 2021-02-17 DIAGNOSIS — D225 Melanocytic nevi of trunk: Secondary | ICD-10-CM | POA: Diagnosis not present

## 2021-02-21 DIAGNOSIS — Z0000 Encounter for general adult medical examination without abnormal findings: Secondary | ICD-10-CM | POA: Diagnosis not present

## 2021-02-21 DIAGNOSIS — Z125 Encounter for screening for malignant neoplasm of prostate: Secondary | ICD-10-CM | POA: Diagnosis not present

## 2021-02-21 DIAGNOSIS — E782 Mixed hyperlipidemia: Secondary | ICD-10-CM | POA: Diagnosis not present

## 2021-03-21 ENCOUNTER — Other Ambulatory Visit: Payer: Self-pay

## 2021-03-21 DIAGNOSIS — M720 Palmar fascial fibromatosis [Dupuytren]: Secondary | ICD-10-CM | POA: Diagnosis not present

## 2021-03-25 DIAGNOSIS — M25642 Stiffness of left hand, not elsewhere classified: Secondary | ICD-10-CM | POA: Diagnosis not present

## 2021-03-25 DIAGNOSIS — M720 Palmar fascial fibromatosis [Dupuytren]: Secondary | ICD-10-CM | POA: Diagnosis not present

## 2021-03-25 DIAGNOSIS — Z4889 Encounter for other specified surgical aftercare: Secondary | ICD-10-CM | POA: Diagnosis not present

## 2021-05-02 DIAGNOSIS — R52 Pain, unspecified: Secondary | ICD-10-CM | POA: Diagnosis not present

## 2021-05-02 DIAGNOSIS — M25642 Stiffness of left hand, not elsewhere classified: Secondary | ICD-10-CM | POA: Diagnosis not present

## 2021-05-02 DIAGNOSIS — M25632 Stiffness of left wrist, not elsewhere classified: Secondary | ICD-10-CM | POA: Diagnosis not present

## 2021-05-02 DIAGNOSIS — M720 Palmar fascial fibromatosis [Dupuytren]: Secondary | ICD-10-CM | POA: Diagnosis not present

## 2021-05-05 DIAGNOSIS — M25632 Stiffness of left wrist, not elsewhere classified: Secondary | ICD-10-CM | POA: Diagnosis not present

## 2021-05-05 DIAGNOSIS — R52 Pain, unspecified: Secondary | ICD-10-CM | POA: Diagnosis not present

## 2021-05-05 DIAGNOSIS — M720 Palmar fascial fibromatosis [Dupuytren]: Secondary | ICD-10-CM | POA: Diagnosis not present

## 2021-05-05 DIAGNOSIS — M25642 Stiffness of left hand, not elsewhere classified: Secondary | ICD-10-CM | POA: Diagnosis not present

## 2021-05-13 DIAGNOSIS — M720 Palmar fascial fibromatosis [Dupuytren]: Secondary | ICD-10-CM | POA: Diagnosis not present

## 2021-05-13 DIAGNOSIS — R52 Pain, unspecified: Secondary | ICD-10-CM | POA: Diagnosis not present

## 2021-05-13 DIAGNOSIS — M25642 Stiffness of left hand, not elsewhere classified: Secondary | ICD-10-CM | POA: Diagnosis not present

## 2021-05-20 DIAGNOSIS — R52 Pain, unspecified: Secondary | ICD-10-CM | POA: Diagnosis not present

## 2021-05-20 DIAGNOSIS — M720 Palmar fascial fibromatosis [Dupuytren]: Secondary | ICD-10-CM | POA: Diagnosis not present

## 2021-05-20 DIAGNOSIS — M25632 Stiffness of left wrist, not elsewhere classified: Secondary | ICD-10-CM | POA: Diagnosis not present

## 2021-05-20 DIAGNOSIS — M25642 Stiffness of left hand, not elsewhere classified: Secondary | ICD-10-CM | POA: Diagnosis not present

## 2021-05-29 DIAGNOSIS — M25642 Stiffness of left hand, not elsewhere classified: Secondary | ICD-10-CM | POA: Diagnosis not present

## 2021-05-29 DIAGNOSIS — R52 Pain, unspecified: Secondary | ICD-10-CM | POA: Diagnosis not present

## 2021-05-29 DIAGNOSIS — M25632 Stiffness of left wrist, not elsewhere classified: Secondary | ICD-10-CM | POA: Diagnosis not present

## 2021-05-29 DIAGNOSIS — M720 Palmar fascial fibromatosis [Dupuytren]: Secondary | ICD-10-CM | POA: Diagnosis not present

## 2021-06-13 DIAGNOSIS — M25632 Stiffness of left wrist, not elsewhere classified: Secondary | ICD-10-CM | POA: Diagnosis not present

## 2021-06-13 DIAGNOSIS — R52 Pain, unspecified: Secondary | ICD-10-CM | POA: Diagnosis not present

## 2021-06-13 DIAGNOSIS — M25642 Stiffness of left hand, not elsewhere classified: Secondary | ICD-10-CM | POA: Diagnosis not present

## 2021-06-13 DIAGNOSIS — M720 Palmar fascial fibromatosis [Dupuytren]: Secondary | ICD-10-CM | POA: Diagnosis not present

## 2021-06-18 DIAGNOSIS — M25642 Stiffness of left hand, not elsewhere classified: Secondary | ICD-10-CM | POA: Diagnosis not present

## 2021-06-18 DIAGNOSIS — M720 Palmar fascial fibromatosis [Dupuytren]: Secondary | ICD-10-CM | POA: Diagnosis not present

## 2021-06-18 DIAGNOSIS — M25632 Stiffness of left wrist, not elsewhere classified: Secondary | ICD-10-CM | POA: Diagnosis not present

## 2021-06-26 DIAGNOSIS — M720 Palmar fascial fibromatosis [Dupuytren]: Secondary | ICD-10-CM | POA: Diagnosis not present

## 2021-07-02 DIAGNOSIS — M25632 Stiffness of left wrist, not elsewhere classified: Secondary | ICD-10-CM | POA: Diagnosis not present

## 2021-07-02 DIAGNOSIS — M720 Palmar fascial fibromatosis [Dupuytren]: Secondary | ICD-10-CM | POA: Diagnosis not present

## 2021-07-02 DIAGNOSIS — R52 Pain, unspecified: Secondary | ICD-10-CM | POA: Diagnosis not present

## 2021-07-02 DIAGNOSIS — M25642 Stiffness of left hand, not elsewhere classified: Secondary | ICD-10-CM | POA: Diagnosis not present

## 2021-07-09 DIAGNOSIS — M25632 Stiffness of left wrist, not elsewhere classified: Secondary | ICD-10-CM | POA: Diagnosis not present

## 2021-07-09 DIAGNOSIS — M720 Palmar fascial fibromatosis [Dupuytren]: Secondary | ICD-10-CM | POA: Diagnosis not present

## 2021-07-09 DIAGNOSIS — M25642 Stiffness of left hand, not elsewhere classified: Secondary | ICD-10-CM | POA: Diagnosis not present

## 2021-07-09 DIAGNOSIS — R52 Pain, unspecified: Secondary | ICD-10-CM | POA: Diagnosis not present

## 2021-07-14 DIAGNOSIS — M720 Palmar fascial fibromatosis [Dupuytren]: Secondary | ICD-10-CM | POA: Diagnosis not present

## 2021-07-21 DIAGNOSIS — M25632 Stiffness of left wrist, not elsewhere classified: Secondary | ICD-10-CM | POA: Diagnosis not present

## 2021-07-21 DIAGNOSIS — M720 Palmar fascial fibromatosis [Dupuytren]: Secondary | ICD-10-CM | POA: Diagnosis not present

## 2021-07-21 DIAGNOSIS — M25642 Stiffness of left hand, not elsewhere classified: Secondary | ICD-10-CM | POA: Diagnosis not present

## 2021-07-31 DIAGNOSIS — M720 Palmar fascial fibromatosis [Dupuytren]: Secondary | ICD-10-CM | POA: Diagnosis not present

## 2022-02-04 ENCOUNTER — Encounter: Payer: Self-pay | Admitting: Gastroenterology

## 2022-03-24 ENCOUNTER — Encounter: Payer: Self-pay | Admitting: Gastroenterology

## 2022-04-27 ENCOUNTER — Ambulatory Visit (AMBULATORY_SURGERY_CENTER): Payer: BC Managed Care – PPO | Admitting: *Deleted

## 2022-04-27 ENCOUNTER — Encounter: Payer: Self-pay | Admitting: Gastroenterology

## 2022-04-27 VITALS — Ht 72.0 in | Wt 175.0 lb

## 2022-04-27 DIAGNOSIS — Z800 Family history of malignant neoplasm of digestive organs: Secondary | ICD-10-CM

## 2022-04-27 DIAGNOSIS — Z86010 Personal history of colonic polyps: Secondary | ICD-10-CM

## 2022-04-27 MED ORDER — NA SULFATE-K SULFATE-MG SULF 17.5-3.13-1.6 GM/177ML PO SOLN: 1.0000 | Freq: Once | ORAL | 0 refills | Status: AC

## 2022-04-27 NOTE — Progress Notes (Signed)
No egg or soy allergy known to patient  No issues known to pt with past sedation with any surgeries or procedures Patient denies ever being told they had issues or difficulty with intubation but is not sure ever had it done No issues moving head or neck No issues with swallowing No FH of Malignant Hyperthermia Pt is not on diet pills Pt is not on  home 02  Pt is not on blood thinners  Pt denies issues with constipation  Pt is not on dialysis Pt denies any upcoming cardiac testing Pt encouraged to use to use Singlecare or Goodrx to reduce cost  Patient's chart reviewed by Osvaldo Angst CNRA prior to previsit and patient appropriate for the [place].  Previsit completed and red dot placed by patient's name on their procedure day (on provider's schedule).  . Visit by phone Instructions sent by mail

## 2022-05-05 DIAGNOSIS — E782 Mixed hyperlipidemia: Secondary | ICD-10-CM | POA: Diagnosis not present

## 2022-05-05 DIAGNOSIS — Z125 Encounter for screening for malignant neoplasm of prostate: Secondary | ICD-10-CM | POA: Diagnosis not present

## 2022-05-05 DIAGNOSIS — Z0000 Encounter for general adult medical examination without abnormal findings: Secondary | ICD-10-CM | POA: Diagnosis not present

## 2022-05-08 ENCOUNTER — Encounter: Payer: Self-pay | Admitting: Gastroenterology

## 2022-05-18 ENCOUNTER — Ambulatory Visit (AMBULATORY_SURGERY_CENTER): Payer: BC Managed Care – PPO | Admitting: Gastroenterology

## 2022-05-18 ENCOUNTER — Encounter: Payer: Self-pay | Admitting: Gastroenterology

## 2022-05-18 VITALS — BP 101/50 | HR 51 | Temp 97.5°F | Resp 12 | Ht 72.0 in | Wt 175.0 lb

## 2022-05-18 DIAGNOSIS — Z800 Family history of malignant neoplasm of digestive organs: Secondary | ICD-10-CM

## 2022-05-18 DIAGNOSIS — D122 Benign neoplasm of ascending colon: Secondary | ICD-10-CM

## 2022-05-18 DIAGNOSIS — Z86010 Personal history of colonic polyps: Secondary | ICD-10-CM | POA: Diagnosis not present

## 2022-05-18 DIAGNOSIS — Z09 Encounter for follow-up examination after completed treatment for conditions other than malignant neoplasm: Secondary | ICD-10-CM | POA: Diagnosis not present

## 2022-05-18 DIAGNOSIS — D123 Benign neoplasm of transverse colon: Secondary | ICD-10-CM | POA: Diagnosis not present

## 2022-05-18 DIAGNOSIS — D124 Benign neoplasm of descending colon: Secondary | ICD-10-CM | POA: Diagnosis not present

## 2022-05-18 DIAGNOSIS — D125 Benign neoplasm of sigmoid colon: Secondary | ICD-10-CM

## 2022-05-18 DIAGNOSIS — Z1211 Encounter for screening for malignant neoplasm of colon: Secondary | ICD-10-CM | POA: Diagnosis not present

## 2022-05-18 MED ORDER — SODIUM CHLORIDE 0.9 % IV SOLN: 500.0000 mL | Freq: Once | INTRAVENOUS | Status: DC

## 2022-05-18 NOTE — Progress Notes (Signed)
A and O x3. Report to RN. Tolerated MAC anesthesia well. 

## 2022-05-18 NOTE — Progress Notes (Signed)
History and Physical:  This patient presents for endoscopic testing for: Encounter Diagnoses  Name Primary?   Hx of colonic polyp Yes   Family history of colon cancer     Diminutive TA x 2 in Nov 2018 Father had CRC  Patient is otherwise without complaints or active issues today.   Past Medical History: Past Medical History:  Diagnosis Date   Hyperlipidemia      Past Surgical History: Past Surgical History:  Procedure Laterality Date   COLONOSCOPY  04/06/2005   DR.ORR   HAND SURGERY     L hand   VASECTOMY      Allergies: No Known Allergies  Outpatient Meds: Current Outpatient Medications  Medication Sig Dispense Refill   ATORVASTATIN CALCIUM PO Take 10 mg by mouth daily at 6 (six) AM.     Current Facility-Administered Medications  Medication Dose Route Frequency Provider Last Rate Last Admin   0.9 %  sodium chloride infusion  500 mL Intravenous Once Danis, Kirke Corin, MD          ___________________________________________________________________ Objective   Exam:  BP (!) 106/56 (BP Location: Right Arm, Patient Position: Sitting, Cuff Size: Normal)   Pulse 64   Temp (!) 97.5 F (36.4 C) (Temporal)   Ht 6' (1.829 m)   Wt 175 lb (79.4 kg)   SpO2 100%   BMI 23.73 kg/m   CV: regular , S1/S2 Resp: clear to auscultation bilaterally, normal RR and effort noted GI: soft, no tenderness, with active bowel sounds.   Assessment: Encounter Diagnoses  Name Primary?   Hx of colonic polyp Yes   Family history of colon cancer      Plan: Colonoscopy  The benefits and risks of the planned procedure were described in detail with the patient or (when appropriate) their health care proxy.  Risks were outlined as including, but not limited to, bleeding, infection, perforation, adverse medication reaction leading to cardiac or pulmonary decompensation, pancreatitis (if ERCP).  The limitation of incomplete mucosal visualization was also discussed.  No guarantees or  warranties were given.    The patient is appropriate for an endoscopic procedure in the ambulatory setting.   - Wilfrid Lund, MD

## 2022-05-18 NOTE — Op Note (Signed)
[facility] Patient Name: Michaela Jourdan Procedure Date: 05/18/2022 8:36 AM MRN: [id number] Endoscopist: Walden. Loletha Carrow , MD, [id number] Age: 57 Referring MD:  Date of Birth: [birth year] Gender: Male Account #: [id number] Procedure:                Colonoscopy Indications:              Screening in patient at increased risk: Colorectal                            cancer in father 60 or older, Surveillance:                            Personal history of adenomatous polyps on last                            colonoscopy > 5 years ago (2 subcentimeter tubular                            adenomas last colonoscopy November 2018) Medicines:                Monitored Anesthesia Care Procedure:                Pre-Anesthesia Assessment:                           - Prior to the procedure, a History and Physical                            was performed, and patient medications and                            allergies were reviewed. The patient's tolerance of                            previous anesthesia was also reviewed. The risks                            and benefits of the procedure and the sedation                            options and risks were discussed with the patient.                            All questions were answered, and informed consent                            was obtained. Prior Anticoagulants: The patient has                            taken no anticoagulant or antiplatelet agents. ASA                            Grade Assessment: II - A patient with mild systemic  disease. After reviewing the risks and benefits,                            the patient was deemed in satisfactory condition to                            undergo the procedure.                           After obtaining informed consent, the colonoscope                            was passed under direct vision. Throughout the                            procedure, the patient's  blood pressure, pulse, and                            oxygen saturations were monitored continuously. The                            Olympus CF-HQ190L [id number] [id number]) Colonoscope was                            introduced through the anus and advanced to the the                            cecum, identified by appendiceal orifice and                            ileocecal valve. The colonoscopy was performed with                            difficulty due to a redundant colon and significant                            looping. Successful completion of the procedure was                            aided by changing the patient to a semi-prone                            position, abdominal binder, using manual pressure                            and straightening and shortening the scope to                            obtain bowel loop reduction. The patient tolerated                            the procedure well. The quality of the bowel  preparation was good. The ileocecal valve,                            appendiceal orifice, and rectum were photographed.                            The bowel preparation used was SUPREP via split                            dose instruction. Scope In: 8:45:03 AM Scope Out: 9:12:46 AM Scope Withdrawal Time: 0 hours 22 minutes 37 seconds  Total Procedure Duration: 0 hours 27 minutes 43 seconds  Findings:                 Grade 2 internal hemorrhoids were found on perianal                            exam.                           The digital rectal exam was normal.                           Repeat examination of right colon under NBI                            performed.                           Multiple diverticula were found in the left colon.                           The sigmoid colon was redundant.                           Four sessile polyps were found in the distal                            sigmoid colon, descending colon, transverse  colon                            and ascending colon. The polyps were 2 to 8 mm in                            size. These polyps were removed with a cold snare.                            Resection and retrieval were complete.                           A 12 mm polyp was found in the splenic flexure. The                            polyp was multi-lobulated and sessile and adjacent  to a diverticulum (see photo). The polyp was                            removed with a piecemeal technique using a cold                            snare. Resection and retrieval were complete.                           Internal hemorrhoids were found. The hemorrhoids                            were large and Grade II (internal hemorrhoids that                            prolapse but reduce spontaneously).                           The exam was otherwise without abnormality on                            direct and retroflexion views. Complications:            No immediate complications. Estimated Blood Loss:     Estimated blood loss was minimal. Impression:               - Hemorrhoids found on perianal exam.                           - Diverticulosis in the left colon.                           - Redundant colon.                           - Four 2 to 8 mm polyps in the distal sigmoid                            colon, in the descending colon, in the transverse                            colon and in the ascending colon, removed with a                            cold snare. Resected and retrieved.                           - One 12 mm polyp at the splenic flexure, removed                            piecemeal using a cold snare. Resected and                            retrieved.                           -  Internal hemorrhoids.                           - The examination was otherwise normal on direct                            and retroflexion views. Recommendation:           - Patient  has a contact number available for                            emergencies. The signs and symptoms of potential                            delayed complications were discussed with the                            patient. Return to normal activities tomorrow.                            Written discharge instructions were provided to the                            patient.                           - Resume previous diet.                           - Continue present medications.                           - Await pathology results.                           - Repeat colonoscopy is recommended for                            surveillance. The colonoscopy date will be                            determined after pathology results from today's                            exam become available for review. Kyrstin Campillo L. Loletha Carrow, MD 05/18/2022 9:20:38 AM This report has been signed electronically.

## 2022-05-18 NOTE — Patient Instructions (Addendum)
Resume previous diet. Continue present medications. Await pathology results. Repeat colonoscopy is recommended for surveillance. The colonoscopy date will be determined after pathology results from today's exam become available for review.  YOU HAD AN ENDOSCOPIC PROCEDURE TODAY AT [facility]:   Refer to the procedure report that was given to you for any specific questions about what was found during the examination.  If the procedure report does not answer your questions, please call your gastroenterologist to clarify.  If you requested that your care partner not be given the details of your procedure findings, then the procedure report has been included in a sealed envelope for you to review at your convenience later.  YOU SHOULD EXPECT: Some feelings of bloating in the abdomen. Passage of more gas than usual.  Walking can help get rid of the air that was put into your GI tract during the procedure and reduce the bloating. If you had a lower endoscopy (such as a colonoscopy or flexible sigmoidoscopy) you may notice spotting of blood in your stool or on the toilet paper. If you underwent a bowel prep for your procedure, you may not have a normal bowel movement for a few days.  Please Note:  You might notice some irritation and congestion in your nose or some drainage.  This is from the oxygen used during your procedure.  There is no need for concern and it should clear up in a day or so.  SYMPTOMS TO REPORT IMMEDIATELY:  Following lower endoscopy (colonoscopy or flexible sigmoidoscopy):  Excessive amounts of blood in the stool  Significant tenderness or worsening of abdominal pains  Swelling of the abdomen that is new, acute  Fever of 100F or higher   For urgent or emergent issues, a gastroenterologist can be reached at any hour by calling [phone]. Do not use MyChart messaging for urgent concerns.    DIET:  We do recommend a small meal at first, but then you may  proceed to your regular diet.  Drink plenty of fluids but you should avoid alcoholic beverages for 24 hours.  ACTIVITY:  You should plan to take it easy for the rest of today and you should NOT DRIVE or use heavy machinery until tomorrow (because of the sedation medicines used during the test).    FOLLOW UP: Our staff will call the number listed on your records the next business day following your procedure.  We will call around 7:15- 8:00 am to check on you and address any questions or concerns that you may have regarding the information given to you following your procedure. If we do not reach you, we will leave a message.     If any biopsies were taken you will be contacted by phone or by letter within the next 1-3 weeks.  Please call us at [phone] if you have not heard about the biopsies in 3 weeks.    SIGNATURES/CONFIDENTIALITY: You and/or your care partner have signed paperwork which will be entered into your electronic medical record.  These signatures attest to the fact that that the information above on your After Visit Summary has been reviewed and is understood.  Full responsibility of the confidentiality of this discharge information lies with you and/or your care-partner.

## 2022-05-18 NOTE — Progress Notes (Signed)
Vitals-DT  Pt's states no medical or surgical changes since previsit or office visit.  

## 2022-05-18 NOTE — Progress Notes (Signed)
Called to room to assist during endoscopic procedure.  Patient ID and intended procedure confirmed with present staff. Received instructions for my participation in the procedure from the performing physician.  

## 2022-05-19 ENCOUNTER — Telehealth: Payer: Self-pay

## 2022-05-19 NOTE — Telephone Encounter (Signed)
  Follow up Call-     05/18/2022    7:49 AM 05/18/2022    7:43 AM  Call back number  Post procedure Call Back phone  # [phone]   Permission to leave phone message  Yes     Patient questions:  Do you have a fever, pain , or abdominal swelling? No. Pain Score  0 *  Have you tolerated food without any problems? Yes.    Have you been able to return to your normal activities? Yes.    Do you have any questions about your discharge instructions: Diet   No. Medications  No. Follow up visit  No.  Do you have questions or concerns about your Care? No.  Actions: * If pain score is 4 or above: No action needed, pain <4.

## 2022-05-25 ENCOUNTER — Encounter: Payer: Self-pay | Admitting: Gastroenterology

## 2023-07-01 DIAGNOSIS — H2513 Age-related nuclear cataract, bilateral: Secondary | ICD-10-CM | POA: Diagnosis not present

## 2023-07-01 DIAGNOSIS — H43813 Vitreous degeneration, bilateral: Secondary | ICD-10-CM | POA: Diagnosis not present

## 2023-07-01 DIAGNOSIS — H5213 Myopia, bilateral: Secondary | ICD-10-CM | POA: Diagnosis not present

## 2023-07-01 DIAGNOSIS — H524 Presbyopia: Secondary | ICD-10-CM | POA: Diagnosis not present

## 2023-11-24 DIAGNOSIS — Z0000 Encounter for general adult medical examination without abnormal findings: Secondary | ICD-10-CM | POA: Diagnosis not present

## 2023-11-24 DIAGNOSIS — Z6823 Body mass index (BMI) 23.0-23.9, adult: Secondary | ICD-10-CM | POA: Diagnosis not present

## 2023-11-24 DIAGNOSIS — I10 Essential (primary) hypertension: Secondary | ICD-10-CM | POA: Diagnosis not present

## 2023-11-24 DIAGNOSIS — E782 Mixed hyperlipidemia: Secondary | ICD-10-CM | POA: Diagnosis not present

## 2023-11-24 DIAGNOSIS — F17210 Nicotine dependence, cigarettes, uncomplicated: Secondary | ICD-10-CM | POA: Diagnosis not present

## 2023-11-24 DIAGNOSIS — Z125 Encounter for screening for malignant neoplasm of prostate: Secondary | ICD-10-CM | POA: Diagnosis not present

## 2023-11-26 ENCOUNTER — Other Ambulatory Visit: Payer: Self-pay | Admitting: Family Medicine

## 2023-11-26 DIAGNOSIS — F17210 Nicotine dependence, cigarettes, uncomplicated: Secondary | ICD-10-CM

## 2023-12-10 ENCOUNTER — Ambulatory Visit
Admission: RE | Admit: 2023-12-10 | Discharge: 2023-12-10 | Disposition: A | Discharge status: Home / Self Care | Source: Ambulatory Visit | Attending: Family Medicine | Admitting: Family Medicine

## 2023-12-10 DIAGNOSIS — F17210 Nicotine dependence, cigarettes, uncomplicated: Secondary | ICD-10-CM | POA: Diagnosis not present
